# Patient Record
Sex: Female | Born: 1980 | Race: White | Hispanic: No | Marital: Single | Smoking: Never smoker
Health system: Southern US, Community
[De-identification: ages and names within clinical notes are randomized; demographics above are authoritative.]

## PROBLEM LIST (undated history)

## (undated) DIAGNOSIS — D649 Anemia, unspecified: Secondary | ICD-10-CM

## (undated) DIAGNOSIS — E7212 Methylenetetrahydrofolate reductase deficiency: Secondary | ICD-10-CM

## (undated) DIAGNOSIS — D069 Carcinoma in situ of cervix, unspecified: Secondary | ICD-10-CM

## (undated) DIAGNOSIS — F419 Anxiety disorder, unspecified: Secondary | ICD-10-CM

## (undated) DIAGNOSIS — O99280 Endocrine, nutritional and metabolic diseases complicating pregnancy, unspecified trimester: Secondary | ICD-10-CM

## (undated) DIAGNOSIS — F329 Major depressive disorder, single episode, unspecified: Secondary | ICD-10-CM

## (undated) DIAGNOSIS — F32A Depression, unspecified: Secondary | ICD-10-CM

## (undated) HISTORY — PX: DILATION AND CURETTAGE OF UTERUS: SHX78

## (undated) HISTORY — PX: OTHER SURGICAL HISTORY: SHX169

---

## 2012-11-03 ENCOUNTER — Ambulatory Visit: Payer: Self-pay | Admitting: Gynecologic Oncology

## 2012-12-07 ENCOUNTER — Emergency Department (HOSPITAL_COMMUNITY): Payer: 59

## 2012-12-07 ENCOUNTER — Encounter (HOSPITAL_COMMUNITY): Payer: Self-pay | Admitting: *Deleted

## 2012-12-07 ENCOUNTER — Emergency Department (HOSPITAL_COMMUNITY)
Admission: EM | Admit: 2012-12-07 | Discharge: 2012-12-07 | Disposition: A | Discharge status: Home / Self Care | Payer: 59 | Attending: Emergency Medicine | Admitting: Emergency Medicine

## 2012-12-07 DIAGNOSIS — R110 Nausea: Secondary | ICD-10-CM | POA: Insufficient documentation

## 2012-12-07 DIAGNOSIS — R630 Anorexia: Secondary | ICD-10-CM | POA: Insufficient documentation

## 2012-12-07 DIAGNOSIS — Z79899 Other long term (current) drug therapy: Secondary | ICD-10-CM | POA: Insufficient documentation

## 2012-12-07 DIAGNOSIS — Z3202 Encounter for pregnancy test, result negative: Secondary | ICD-10-CM | POA: Insufficient documentation

## 2012-12-07 DIAGNOSIS — Z888 Allergy status to other drugs, medicaments and biological substances status: Secondary | ICD-10-CM | POA: Insufficient documentation

## 2012-12-07 DIAGNOSIS — R1013 Epigastric pain: Secondary | ICD-10-CM

## 2012-12-07 DIAGNOSIS — C539 Malignant neoplasm of cervix uteri, unspecified: Secondary | ICD-10-CM | POA: Insufficient documentation

## 2012-12-07 HISTORY — DX: Endocrine, nutritional and metabolic diseases complicating pregnancy, unspecified trimester: O99.280

## 2012-12-07 HISTORY — DX: Methylenetetrahydrofolate reductase deficiency: E72.12

## 2012-12-07 LAB — CBC WITH DIFFERENTIAL/PLATELET
Basophils Absolute: 0 10*3/uL (ref 0.0–0.1)
Lymphocytes Relative: 32 % (ref 12–46)
Lymphs Abs: 1.9 10*3/uL (ref 0.7–4.0)
Neutrophils Relative %: 54 % (ref 43–77)
Platelets: 270 10*3/uL (ref 150–400)
RBC: 4.1 MIL/uL (ref 3.87–5.11)
WBC: 5.8 10*3/uL (ref 4.0–10.5)

## 2012-12-07 LAB — URINALYSIS, ROUTINE W REFLEX MICROSCOPIC
Bilirubin Urine: NEGATIVE
Glucose, UA: NEGATIVE mg/dL
Specific Gravity, Urine: 1.017 (ref 1.005–1.030)
Urobilinogen, UA: 0.2 mg/dL (ref 0.0–1.0)

## 2012-12-07 LAB — URINE MICROSCOPIC-ADD ON

## 2012-12-07 LAB — POCT PREGNANCY, URINE: Preg Test, Ur: NEGATIVE

## 2012-12-07 LAB — COMPREHENSIVE METABOLIC PANEL
ALT: 16 U/L (ref 0–35)
AST: 18 U/L (ref 0–37)
Alkaline Phosphatase: 49 U/L (ref 39–117)
CO2: 25 mEq/L (ref 19–32)
GFR calc Af Amer: >90 mL/min (ref >90)
GFR calc non Af Amer: >90 mL/min (ref >90)
Glucose, Bld: 92 mg/dL (ref 70–99)
Potassium: 3.5 mEq/L (ref 3.5–5.1)
Sodium: 137 mEq/L (ref 135–145)
Total Protein: 6.9 g/dL (ref 6.0–8.3)

## 2012-12-07 MED ORDER — OMEPRAZOLE 20 MG PO CPDR: 20.0000 mg | DELAYED_RELEASE_CAPSULE | Freq: Every day | ORAL | Status: DC

## 2012-12-07 MED ORDER — GI COCKTAIL ~~LOC~~
30.0000 mL | Freq: Once | ORAL | Status: AC
  Administered 2012-12-07: 30 mL via ORAL
  Filled 2012-12-07: qty 30

## 2012-12-07 MED ORDER — SODIUM CHLORIDE 0.9 % IV SOLN
INTRAVENOUS | Status: DC
  Administered 2012-12-07: 08:00:00 via INTRAVENOUS

## 2012-12-07 MED ORDER — PERCOCET 5-325 MG PO TABS: 1.0000 | ORAL_TABLET | Freq: Four times a day (QID) | ORAL | Status: DC | PRN

## 2012-12-07 MED ORDER — ONDANSETRON HCL 4 MG/2ML IJ SOLN
4.0000 mg | Freq: Once | INTRAMUSCULAR | Status: AC
  Administered 2012-12-07: 4 mg via INTRAVENOUS
  Filled 2012-12-07: qty 2

## 2012-12-07 MED ORDER — ONDANSETRON 4 MG PO TBDP: 4.0000 mg | ORAL_TABLET | Freq: Four times a day (QID) | ORAL | Status: DC | PRN

## 2012-12-07 MED ORDER — MORPHINE SULFATE 4 MG/ML IJ SOLN
4.0000 mg | Freq: Once | INTRAMUSCULAR | Status: AC
Start: 2012-12-07 — End: 2012-12-07
  Administered 2012-12-07: 4 mg via INTRAVENOUS
  Filled 2012-12-07: qty 1

## 2012-12-07 MED ORDER — RANITIDINE HCL 150 MG PO TABS: 150.0000 mg | ORAL_TABLET | Freq: Two times a day (BID) | ORAL | Status: DC

## 2012-12-07 NOTE — ED Provider Notes (Signed)
Medical screening examination/treatment/procedure(s) were conducted as a shared visit with non-physician practitioner(s) and myself.  I personally evaluated the patient during the encounter  RUQ tenderness. No clear evidence of gallstones on bedside ultrasound--will obtain formal study.   Hanley Seamen, MD 12/07/12 [phone]

## 2012-12-07 NOTE — ED Provider Notes (Signed)
History     CSN: [id number]  Arrival date & time 12/07/12  0620   None     Chief Complaint  Patient presents with  . Abdominal Pain    Abd pain that started last night pain is midline with a sharpe sensation and increases with activity    (Consider location/radiation/quality/duration/timing/severity/associated sxs/prior treatment) HPI Kathleen Sanford is a 32 y.o. female with a history of cervical cancer (cold knife cone biopsy on 4/28 by Dr. Nelly Rout in [facility]) presents emergency department complaining of epigastric and right upper quadrant abdominal pain.  Onset of symptoms began last evening around 8 p.m. and are associated with nausea that started this morning.  Pain is described as a sharp stabbing sensation without radiation as well as a burning type sensation in the epigastric area..  Associated symptoms include anorexia and patient has not eaten since onset.  Severity of pain is described 10/10, however patient states that she did not want any narcotic pain medication and she would like to return to work if there's nothing surgical.  She denies fevers, night sweats, chills, change in bowel movements, emesis, history of abdominal surgery.  Past Medical History  Diagnosis Date  . MTHFR deficiency complicating pregnancy     History reviewed. No pertinent past surgical history.  History reviewed. No pertinent family history.  History  Substance Use Topics  . Smoking status: Never Smoker   . Smokeless tobacco: Not on file  . Alcohol Use: Yes     Comment: socially    OB History   Grav Para Term Preterm Abortions TAB SAB Ect Mult Living                  Review of Systems Ten systems reviewed and are negative for acute change, except as noted in the HPI.    Allergies  Norco and Toradol  Home Medications   Current Outpatient Rx  Name  Route  Sig  Dispense  Refill  . Vitamin D, Ergocalciferol, (DRISDOL) 50000 UNITS CAPS   Oral   Take 50,000 Units by mouth every  7 (seven) days. Takes on Friday           BP 103/66  Pulse 81  Temp(Src) 97.9 F (36.6 C) (Oral)  Resp 18  SpO2 100%  Physical Exam  Nursing note and vitals reviewed. Constitutional: Vital signs are normal. She appears well-developed and well-nourished. No distress.  HENT:  Head: Normocephalic and atraumatic.  Mouth/Throat: Uvula is midline, oropharynx is clear and moist and mucous membranes are normal.  Eyes: Conjunctivae and EOM are normal. Pupils are equal, round, and reactive to light.  Neck: Normal range of motion and full passive range of motion without pain. Neck supple. No spinous process tenderness and no muscular tenderness present. No rigidity. No Brudzinski's sign noted.  Cardiovascular: Normal rate and regular rhythm.   Pulmonary/Chest: Effort normal and breath sounds normal. No accessory muscle usage. Not tachypneic. No respiratory distress.  Abdominal: Soft. Normal appearance. She exhibits no distension, no ascites, no pulsatile midline mass and no mass. There is tenderness. There is no CVA tenderness. No hernia.    Lymphadenopathy:    She has no cervical adenopathy.  Neurological: She is alert.  Skin: Skin is warm and dry. No rash noted. She is not diaphoretic.  Psychiatric: She has a normal mood and affect. Her speech is normal and behavior is normal.    ED Course  Procedures (including critical care time)  Labs Reviewed  CBC WITH DIFFERENTIAL -  Abnormal; Notable for the following:    Hemoglobin 11.7 (*)    HCT 34.4 (*)    All other components within normal limits  URINALYSIS, ROUTINE W REFLEX MICROSCOPIC - Abnormal; Notable for the following:    Hgb urine dipstick LARGE (*)    All other components within normal limits  COMPREHENSIVE METABOLIC PANEL  LIPASE, BLOOD  URINE MICROSCOPIC-ADD ON  POCT PREGNANCY, URINE   US Abdomen Complete  12/07/2012   *RADIOLOGY REPORT*  Clinical Data:  Right upper quadrant/epigastric abdominal pain.  COMPLETE ABDOMINAL  ULTRASOUND  Comparison:  None  Findings:  Gallbladder: A small amount of debris or sludge is noted within the gallbladder lumen.  No discrete gallstones are demonstrated.  There is no gallbladder wall thickening, pericholecystic fluid or sonographic Murphy's sign.  Common bile duct:   Normal in caliber without filling defects.  Liver:  Echogenicity is within normal limits.  No focal hepatic abnormalities are identified.  IVC:  Visualized portions appear unremarkable.  Pancreas:  Visualized portions appear unremarkable.Portions of the pancreatic tail are obscured by bowel gas.  Spleen:  Visualized portions appear unremarkable.  Right Kidney:   The renal cortical thickness and echogenicity are preserved.  There is no hydronephrosis or focal abnormality. Renal length is 11.0 cm.  Left Kidney:   The renal cortical thickness and echogenicity are preserved.  There is no hydronephrosis or focal abnormality. Renal length is 11.9 cm.  Abdominal aorta:  Visualized portions appear unremarkable.  IMPRESSION:  1.  No acute abdominal findings identified. 2.  Trace debris or sludge within the gallbladder lumen.  No evidence of cholelithiasis or cholecystitis. 3.  Suboptimal visualization of the pancreatic tail.   Original Report Authenticated By: Carey Bullocks, M.D.     No diagnosis found.    MDM  Epigastric abdominal pain (biliary colic vs GERD, vs PUD)  Patient is nontoxic, nonseptic appearing, in no apparent distress.  Patient's pain and other symptoms adequately managed in emergency department.  Fluid bolus given.  Labs, imaging and vitals reviewed.   On repeat exam patient does not have a surgical abdomin and there are nor peritoneal signs.  Patient started on PPI and advised to take H2 blocker for the next 3 days as well. Discussed avoiding NSAIDs use until abdominal symptoms resolve as there may be concern for Ulcer. Discharged home with symptomatic treatment and given strict instructions for follow-up with  their primary care physician.  I have also discussed reasons to return immediately to the ER including movement of abd pain to the lower abdomen, fevers, melena, or anginal type Chest Pain.  Patient expresses understanding and agrees with plan.           Jaci Carrel, [place] 12/07/12 [phone]

## 2012-12-07 NOTE — ED Provider Notes (Signed)
Medical screening examination/treatment/procedure(s) were performed by non-physician practitioner and as supervising physician I was immediately available for consultation/collaboration.   Hanley Seamen, MD 12/07/12 [phone]

## 2012-12-07 NOTE — ED Notes (Signed)
4540  Pt arrives to the ED with midline abdominal pain that increases with activity.  Pt states the pain is stabbing and she is no also c/o nausea that started this morning.  Pain is 10/10 upon arrival to the ER.

## 2012-12-28 ENCOUNTER — Encounter (HOSPITAL_COMMUNITY): Payer: Self-pay | Admitting: Emergency Medicine

## 2012-12-28 ENCOUNTER — Emergency Department (HOSPITAL_COMMUNITY)
Admission: EM | Admit: 2012-12-28 | Discharge: 2012-12-28 | Disposition: A | Discharge status: Home / Self Care | Payer: 59 | Attending: Emergency Medicine | Admitting: Emergency Medicine

## 2012-12-28 DIAGNOSIS — Z8742 Personal history of other diseases of the female genital tract: Secondary | ICD-10-CM | POA: Insufficient documentation

## 2012-12-28 DIAGNOSIS — R51 Headache: Secondary | ICD-10-CM | POA: Insufficient documentation

## 2012-12-28 NOTE — ED Notes (Signed)
Pt reports head pain onset last night, about 30 mins ago pt began to experience left side facial numbness. Pt reports had a small amount of numbness to left hand but has since resolved.

## 2012-12-28 NOTE — ED Notes (Signed)
Pt alert and mentating appropriately upon d/c. Pt given d/c teaching and follow up care instructions. Pt verbalizes understanding of d/c teaching and has no further questions upon d/c. NAD noted upon d/c. Pt ambulatory leaving ER.

## 2012-12-28 NOTE — ED Provider Notes (Signed)
History    CSN: [id number] Arrival date & time 12/28/12  1021  First MD Initiated Contact with Patient 12/28/12 1051     Chief Complaint  Patient presents with  . Numbness    left side of face   (Consider location/radiation/quality/duration/timing/severity/associated sxs/prior Treatment) HPI Comments: Patient is a 32 year old woman who felt a pain in the left side of her scalp near the vertex yesterday evening. She took Advil and got some relief. This morning it hurt really bad, and she also had a numb feeling in the left side of her face and her cheek and jaw area. There has been no fever. She's had no recent infection. There's been no injury. She has had abnormal cervical cytology and had a cone biopsy on May 28, and has had intermittent bleeding from that since then.  Patient is a 32 y.o. female presenting with neurologic complaint. The history is provided by the patient. No language interpreter was used.  Neurologic Problem This is a new problem. The current episode started 12 to 24 hours ago. The problem has not changed since onset.Pertinent negatives include no chest pain, no abdominal pain, no headaches and no shortness of breath. Nothing aggravates the symptoms. Relieved by: She took Advil with some relief.   Past Medical History  Diagnosis Date  . MTHFR deficiency complicating pregnancy    Past Surgical History  Procedure Laterality Date  . Cesarean section     No family history on file. History  Substance Use Topics  . Smoking status: Never Smoker   . Smokeless tobacco: Not on file  . Alcohol Use: Yes     Comment: socially   OB History   Grav Para Term Preterm Abortions TAB SAB Ect Mult Living                 Review of Systems  Constitutional: Negative for fever and chills.  HENT: Negative.   Eyes: Negative.   Respiratory: Negative for shortness of breath.   Cardiovascular: Negative for chest pain.  Gastrointestinal: Negative for abdominal pain.   Genitourinary: Negative.   Musculoskeletal: Negative.   Skin:       No rash noted.  Neurological: Negative for headaches.  Psychiatric/Behavioral: Negative.     Allergies  Norco and Toradol  Home Medications   Current Outpatient Rx  Name  Route  Sig  Dispense  Refill  . omeprazole (PRILOSEC) 20 MG capsule   Oral   Take 20 mg by mouth daily.         . Vitamin D, Ergocalciferol, (DRISDOL) 50000 UNITS CAPS   Oral   Take 50,000 Units by mouth every 7 (seven) days. Takes on Friday          BP 136/77  Pulse 112  Temp(Src) 98.6 F (37 C) (Oral)  Ht 5\' 5"  (1.651 m)  Wt 174 lb (78.926 kg)  BMI 28.96 kg/m2  SpO2 100% Physical Exam  Nursing note and vitals reviewed. Constitutional: She is oriented to person, place, and time. She appears well-developed and well-nourished.  HENT:  Head: Normocephalic.  Right Ear: External ear normal.  Left Ear: External ear normal.  Mouth/Throat: Oropharynx is clear and moist.  No rash seen in the left temporal pole and parietal scalp.  Eyes: Conjunctivae and EOM are normal. Pupils are equal, round, and reactive to light.  Neck: Normal range of motion. Neck supple.  Cardiovascular: Normal rate, regular rhythm and normal heart sounds.   Pulmonary/Chest: Effort normal and breath sounds normal.  Abdominal: Soft. Bowel sounds are normal.  Musculoskeletal: Normal range of motion. She exhibits no edema and no tenderness.  Neurological: She is alert and oriented to person, place, and time.  No facial asymmetry. No sensory or motor deficit. Deep tendon reflexes normal.  Skin: Skin is warm and dry.  No rash in the distribution of the second or third divisions of the trigeminal nerve.  Psychiatric: She has a normal mood and affect. Her behavior is normal.    ED Course  Procedures (including critical care time) Labs Reviewed - No data to display  11:29 AM  Date: 12/28/2012  Rate: 111  Rhythm: sinus tachycardia  QRS Axis: normal   Intervals: normal  ST/T Wave abnormalities: normal  Conduction Disutrbances:none  Narrative Interpretation: Sinus tachycardia  Old EKG Reviewed: none available  Course in ED: The patient was seen and had physical examination. Differential diagnosis for her situation include early herpes zoster, early Bell's palsy, or atypical migraine. At present she is asymptomatic, and so no treatment is offered at present. She was advised of this differential diagnosis and can observe this at home.  She has no primary care physician, so was referred to Herb Grays, M.D., with whom she can establish primary medical care.      1. Headache         Carleene Cooper III, MD 12/29/12 1726

## 2013-01-26 ENCOUNTER — Encounter: Payer: Self-pay | Admitting: Gynecologic Oncology

## 2013-01-26 ENCOUNTER — Ambulatory Visit: Payer: PRIVATE HEALTH INSURANCE | Attending: Gynecologic Oncology | Admitting: Gynecologic Oncology

## 2013-01-26 ENCOUNTER — Other Ambulatory Visit (HOSPITAL_COMMUNITY)
Admission: RE | Admit: 2013-01-26 | Discharge: 2013-01-26 | Disposition: A | Discharge status: Home / Self Care | Payer: PRIVATE HEALTH INSURANCE | Source: Ambulatory Visit | Attending: Gynecologic Oncology | Admitting: Gynecologic Oncology

## 2013-01-26 VITALS — BP 102/70 | HR 80 | Temp 98.6°F | Resp 16 | Ht 65.0 in | Wt 180.0 lb

## 2013-01-26 DIAGNOSIS — Z975 Presence of (intrauterine) contraceptive device: Secondary | ICD-10-CM | POA: Insufficient documentation

## 2013-01-26 DIAGNOSIS — Z01419 Encounter for gynecological examination (general) (routine) without abnormal findings: Secondary | ICD-10-CM | POA: Insufficient documentation

## 2013-01-26 DIAGNOSIS — D069 Carcinoma in situ of cervix, unspecified: Secondary | ICD-10-CM | POA: Insufficient documentation

## 2013-01-26 NOTE — Patient Instructions (Signed)
F/U with Dr. Seymour Bars in 6 months Will call with Pap and ultrasound results

## 2013-01-26 NOTE — Progress Notes (Signed)
GYNECOLOGIC ONCOLOGY RETURN VISIT   REASON FOR VISIT:CIN III  ASSESSMENT AND PLAN:  Kathleen Sanford is a 32 y.o. woman with CIN3 s/p CKC complicated by post op bleeding. Cervix healed at this visit. Pap collected. Unable to visualize the IUD. UTZ ordered to assess position.  If pap negative f/u with Dr. Seymour Bars in 6 months    HPI 32 year old with a history of CIN-3 status post cold knife cone on Nov 16, 2012 presenting with persistent vaginal spotting, last seen on 12/16/12. Of note she has a Mirena IUD which was place in April of this year.  She was last seen on 12/16/12 at which time cautery was used to obtain hemostasis at her CKC site. She reports improved bleeding for a few days until 12/23/12 when she was on vacation. On 7/18 cautery of the cervix was performed to control cervical bleeding.  F/U 01/13/2013 spotting was managed with silver nitrate.   PAST MEDICAL/SURGICAL HX:  Past Medical History   Diagnosis  Date   .  CIN III (cervical intraepithelial neoplasia grade III) with severe dysplasia    .  Anemia    .  CIN III (cervical intraepithelial neoplasia grade III) with severe dysplasia     Past Surgical History   Procedure  Laterality  Date   .  Dilation and curettage of uterus     .  Cesarean section       x 3   .  Pr conization cervix,knife/laser  N/A  11/16/2012     Procedure: CONIZATION OF CERVIX W/WO FULGURATION, W/WO DILATION & CURETTAGE, W/WO REPAIR; COLD KNIFE OR LASER; Surgeon: Doreatha Lew, MD; Location: MAIN OR [facility]; Service: Gynecology Oncology    Family History   Problem  Relation  Age of Onset   .  Ovarian cancer  Maternal Grandmother    .  Cancer  Paternal Grandfather    .  Stroke  Paternal Grandfather    .  Mental illness  Maternal Aunt    .  Cancer  Paternal Aunt     History    Social History Narrative   .  None     REVIEW OF SYSTEMS:  Complete 10-system review is negative except as above in Interval History.   PHYSICAL EXAM:  BP 102/70  Pulse  80  Temp(Src) 98.6 F (37 C) (Oral)  Resp 16  Ht 5\' 5"  (1.651 m)  Wt 180 lb (81.647 kg)  BMI 29.95 kg/m2  General: Alert, oriented, no acute distress.  Abdomen: Soft, nontender.  Extremities: Grossly normal range of motion. Warm, well perfused. No edema bilaterally.  Skin: No rashes or lesions noted.  GU: Normal appearing external genitalia without erythema, excoriation, or lesions. Speculum exam reveals healed cervix.   CKC bed with a pinpoint area of active bleeding. IUD not visualized or palpated with the qtip. Pap collected.

## 2013-02-01 ENCOUNTER — Ambulatory Visit (HOSPITAL_COMMUNITY)
Admission: RE | Admit: 2013-02-01 | Discharge: 2013-02-01 | Disposition: A | Discharge status: Home / Self Care | Payer: PRIVATE HEALTH INSURANCE | Source: Ambulatory Visit | Attending: Gynecologic Oncology | Admitting: Gynecologic Oncology

## 2013-02-01 ENCOUNTER — Telehealth: Payer: Self-pay | Admitting: Gynecologic Oncology

## 2013-02-01 DIAGNOSIS — Z30431 Encounter for routine checking of intrauterine contraceptive device: Secondary | ICD-10-CM | POA: Insufficient documentation

## 2013-02-01 NOTE — Telephone Encounter (Signed)
Patient notified of [place] results and pap smear results.  Asking when she needs to follow up.  Informed that Dr. Nelly Rout would be notified for recommendations for follow up and that she would be contacted by the office.  Instructed to call for any needs or concerns before that time.

## 2013-02-03 ENCOUNTER — Telehealth: Payer: Self-pay | Admitting: Gynecologic Oncology

## 2013-02-03 NOTE — Telephone Encounter (Signed)
Patient informed of Dr. Forrestine Him recommendations for follow up with Dr. Seymour Bars and GYN Onc PRN.  Verbalizing understanding.  Instructed to call for any needs.

## 2013-09-21 ENCOUNTER — Encounter (HOSPITAL_COMMUNITY): Payer: Self-pay | Admitting: Emergency Medicine

## 2013-09-21 ENCOUNTER — Emergency Department (HOSPITAL_COMMUNITY)
Admission: EM | Admit: 2013-09-21 | Discharge: 2013-09-21 | Disposition: A | Discharge status: Home / Self Care | Payer: PRIVATE HEALTH INSURANCE | Attending: Emergency Medicine | Admitting: Emergency Medicine

## 2013-09-21 DIAGNOSIS — J209 Acute bronchitis, unspecified: Secondary | ICD-10-CM | POA: Insufficient documentation

## 2013-09-21 DIAGNOSIS — R630 Anorexia: Secondary | ICD-10-CM | POA: Insufficient documentation

## 2013-09-21 DIAGNOSIS — J029 Acute pharyngitis, unspecified: Secondary | ICD-10-CM

## 2013-09-21 DIAGNOSIS — Z8541 Personal history of malignant neoplasm of cervix uteri: Secondary | ICD-10-CM | POA: Insufficient documentation

## 2013-09-21 DIAGNOSIS — J40 Bronchitis, not specified as acute or chronic: Secondary | ICD-10-CM

## 2013-09-21 DIAGNOSIS — R110 Nausea: Secondary | ICD-10-CM | POA: Insufficient documentation

## 2013-09-21 DIAGNOSIS — IMO0001 Reserved for inherently not codable concepts without codable children: Secondary | ICD-10-CM | POA: Insufficient documentation

## 2013-09-21 DIAGNOSIS — R42 Dizziness and giddiness: Secondary | ICD-10-CM | POA: Insufficient documentation

## 2013-09-21 DIAGNOSIS — R51 Headache: Secondary | ICD-10-CM | POA: Insufficient documentation

## 2013-09-21 DIAGNOSIS — H9209 Otalgia, unspecified ear: Secondary | ICD-10-CM | POA: Insufficient documentation

## 2013-09-21 DIAGNOSIS — Z79899 Other long term (current) drug therapy: Secondary | ICD-10-CM | POA: Insufficient documentation

## 2013-09-21 HISTORY — DX: Carcinoma in situ of cervix, unspecified: D06.9

## 2013-09-21 LAB — RAPID STREP SCREEN (MED CTR MEBANE ONLY): Streptococcus, Group A Screen (Direct): NEGATIVE

## 2013-09-21 MED ORDER — BENZONATATE 100 MG PO CAPS: 100.0000 mg | ORAL_CAPSULE | Freq: Three times a day (TID) | ORAL | Status: DC

## 2013-09-21 MED ORDER — AZITHROMYCIN 250 MG PO TABS: 250.0000 mg | ORAL_TABLET | Freq: Every day | ORAL | Status: DC

## 2013-09-21 NOTE — ED Provider Notes (Signed)
CSN: [id number]     Arrival date & time 09/21/13  2050 History   First MD Initiated Contact with Patient 09/21/13 2132     Chief Complaint  Patient presents with  . Sore Throat     (Consider location/radiation/quality/duration/timing/severity/associated sxs/prior Treatment) Patient is a 33 y.o. female presenting with pharyngitis. The history is provided by the patient.  Sore Throat This is a new problem. The current episode started in the past 7 days. The problem occurs constantly. The problem has been gradually worsening. Associated symptoms include anorexia, chills, congestion, coughing, fatigue, headaches, myalgias, nausea and swollen glands. Pertinent negatives include no fever, rash, urinary symptoms or vomiting. The symptoms are aggravated by eating and swallowing. She has tried acetaminophen and NSAIDs for the symptoms. The treatment provided mild relief.   LILLYONA POLASEK is a 33 y.o. female who presents to the ED with flu like symptoms that started about a week ago. She thought was just allergies but has continue to get worse. Cough is dry, nonproductive.   Past Medical History  Diagnosis Date  . MTHFR deficiency complicating pregnancy   . Carcinoma in situ of cervix    Past Surgical History  Procedure Laterality Date  . Cesarean section     History reviewed. No pertinent family history. History  Substance Use Topics  . Smoking status: Never Smoker   . Smokeless tobacco: Not on file  . Alcohol Use: Yes     Comment: socially   OB History   Grav Para Term Preterm Abortions TAB SAB Ect Mult Living                 Review of Systems  Constitutional: Positive for chills and fatigue. Negative for fever.  HENT: Positive for congestion, ear pain and sinus pressure. Negative for facial swelling and trouble swallowing.   Eyes: Negative for redness and visual disturbance.  Respiratory: Positive for cough.   Gastrointestinal: Positive for nausea and anorexia. Negative for  vomiting, diarrhea and constipation.  Genitourinary: Negative for dysuria, urgency and frequency.  Musculoskeletal: Positive for myalgias.  Skin: Negative for rash.  Neurological: Positive for light-headedness and headaches.  Psychiatric/Behavioral: Negative for confusion. The patient is not nervous/anxious.       Allergies  Norco and Toradol  Home Medications   Current Outpatient Rx  Name  Route  Sig  Dispense  Refill  . omeprazole (PRILOSEC) 20 MG capsule   Oral   Take 20 mg by mouth daily.         . Vitamin D, Ergocalciferol, (DRISDOL) 50000 UNITS CAPS   Oral   Take 50,000 Units by mouth every 7 (seven) days. Takes on Friday          BP 129/60  Pulse 68  Temp(Src) 97.9 F (36.6 C) (Oral)  Resp 20  SpO2 100%  LMP 09/15/2013 Physical Exam  Nursing note and vitals reviewed. Constitutional: She is oriented to person, place, and time. She appears well-developed and well-nourished. No distress.  HENT:  Head: Normocephalic.  Right Ear: Tympanic membrane normal.  Left Ear: Tympanic membrane normal.  Nose: Mucosal edema and rhinorrhea present.  Mouth/Throat: Uvula is midline and mucous membranes are normal. Posterior oropharyngeal erythema present.  Eyes: Conjunctivae and EOM are normal.  Neck: Neck supple.  Cardiovascular: Normal rate, regular rhythm and normal heart sounds.   Pulmonary/Chest: Effort normal. She has no wheezes. She has no rales.  Abdominal: Soft. There is no tenderness.  Musculoskeletal: Normal range of motion.  Lymphadenopathy:  She has cervical adenopathy.  Neurological: She is alert and oriented to person, place, and time. No cranial nerve deficit.  Skin: Skin is warm and dry.  Psychiatric: She has a normal mood and affect. Her behavior is normal.    ED Course  Procedures Discussed CXR and patient states she does not want one at this time if not absolutely necessary.   MDM  33 y.o. female with cough, congestion and sore throat x one  week. Will treat for bronchitis and she will follow up  With her PCP or return here for worsening symptoms. Stable for discharge without fever and O2 SAT 100% on R/A. She does not appear septic. Discussed with the patient clinical findings and plan of care and all questioned fully answered. She will return if any problems arise.   Medication List         azithromycin 250 MG tablet  Commonly known as:  ZITHROMAX  Take 1 tablet (250 mg total) by mouth daily. Take first 2 tablets together, then 1 every day until finished.     benzonatate 100 MG capsule  Commonly known as:  TESSALON  Take 1 capsule (100 mg total) by mouth every 8 (eight) hours.           [place], [place] 09/22/13 [phone]

## 2013-09-21 NOTE — ED Notes (Signed)
Pt seen by [facility] and assessment and evaluation performed.

## 2013-09-21 NOTE — Discharge Instructions (Signed)
Your strep screen was negative. Take the medication as directed. Continue tylenol and ibuprofen as needed for for pain or fever. Return for worsening symptoms.

## 2013-09-21 NOTE — ED Notes (Signed)
PT C/O SORE THROAT X 1 WEEK.  PT STATES WHEN SHE COUGHS OR SNEEZES SHE FEELS LIKE SOMEONE IS STABBING HER WITH A TOOTHPICK. PT ALSO FEELS TIRED AND HAS HAD 2 COLD SORES THIS PAST WEEK.

## 2013-09-23 LAB — CULTURE, GROUP A STREP

## 2013-09-25 NOTE — ED Provider Notes (Signed)
Medical screening examination/treatment/procedure(s) were performed by non-physician practitioner and as supervising physician I was immediately available for consultation/collaboration.   EKG Interpretation None       Nat Christen, MD 09/25/13 [phone]

## 2014-02-18 ENCOUNTER — Encounter (HOSPITAL_COMMUNITY): Payer: Self-pay | Admitting: Emergency Medicine

## 2014-02-18 ENCOUNTER — Emergency Department (HOSPITAL_COMMUNITY)
Admission: EM | Admit: 2014-02-18 | Discharge: 2014-02-18 | Disposition: A | Discharge status: Home / Self Care | Payer: 59 | Attending: Emergency Medicine | Admitting: Emergency Medicine

## 2014-02-18 DIAGNOSIS — H6501 Acute serous otitis media, right ear: Secondary | ICD-10-CM

## 2014-02-18 DIAGNOSIS — J029 Acute pharyngitis, unspecified: Secondary | ICD-10-CM

## 2014-02-18 DIAGNOSIS — H6500 Acute serous otitis media, unspecified ear: Secondary | ICD-10-CM | POA: Diagnosis not present

## 2014-02-18 DIAGNOSIS — Z8541 Personal history of malignant neoplasm of cervix uteri: Secondary | ICD-10-CM | POA: Diagnosis not present

## 2014-02-18 MED ORDER — AMOXICILLIN 250 MG PO CAPS
500.0000 mg | ORAL_CAPSULE | Freq: Once | ORAL | Status: AC
  Administered 2014-02-18: 500 mg via ORAL
  Filled 2014-02-18: qty 2

## 2014-02-18 MED ORDER — AMOXICILLIN 500 MG PO CAPS: 500.0000 mg | ORAL_CAPSULE | Freq: Three times a day (TID) | ORAL | Status: AC

## 2014-02-18 NOTE — ED Provider Notes (Signed)
CSN: [id number]     Arrival date & time 02/18/14  2005 History   First MD Initiated Contact with Patient 02/18/14 2124     Chief Complaint  Patient presents with  . Sore Throat     (Consider location/radiation/quality/duration/timing/severity/associated sxs/prior Treatment) Patient is a 33 y.o. female presenting with pharyngitis. The history is provided by the patient.  Sore Throat This is a new problem. The current episode started yesterday. The problem occurs constantly. The problem has been gradually worsening.   Kathleen Sanford is a 33 y.o. female who presents to the ED with sore throat and right ear pain that started 2 days ago. He son is also sick with tonsillitis. She has had a dry cough that is causing more irritation  To her throat.  Past Medical History  Diagnosis Date  . MTHFR deficiency complicating pregnancy   . Carcinoma in situ of cervix    Past Surgical History  Procedure Laterality Date  . Cesarean section     No family history on file. History  Substance Use Topics  . Smoking status: Never Smoker   . Smokeless tobacco: Not on file  . Alcohol Use: Yes     Comment: socially   OB History   Grav Para Term Preterm Abortions TAB SAB Ect Mult Living                 Review of Systems Negative except as stated in HPI   Allergies  Norco and Toradol  Home Medications   Prior to Admission medications   Medication Sig Start Date End Date Taking? Authorizing Provider  azithromycin (ZITHROMAX) 250 MG tablet Take 1 tablet (250 mg total) by mouth daily. Take first 2 tablets together, then 1 every day until finished. 09/21/13   Hope Bunnie Pion, NP  benzonatate (TESSALON) 100 MG capsule Take 1 capsule (100 mg total) by mouth every 8 (eight) hours. 09/21/13   Hope Bunnie Pion, NP   BP 119/77  Pulse 74  Temp(Src) 99 F (37.2 C) (Oral)  Resp 20  Ht 5\' 5"  (1.651 m)  Wt 178 lb (80.74 kg)  BMI 29.62 kg/m2  SpO2 99%  LMP 01/26/2014 Physical Exam  Nursing note and vitals  reviewed. Constitutional: She is oriented to person, place, and time. She appears well-developed and well-nourished. No distress.  HENT:  Head: Normocephalic.  Right Ear: Tympanic membrane is erythematous.  Left Ear: Tympanic membrane normal.  Nose: Nose normal.  Mouth/Throat: Uvula is midline and mucous membranes are normal. Posterior oropharyngeal erythema present.  Eyes: EOM are normal.  Neck: Neck supple.  Cardiovascular: Normal rate.   Pulmonary/Chest: Effort normal.  Musculoskeletal: Normal range of motion.  Lymphadenopathy:    She has no cervical adenopathy.  Neurological: She is alert and oriented to person, place, and time. No cranial nerve deficit.  Skin: Skin is warm and dry.  Psychiatric: She has a normal mood and affect. Her behavior is normal.    ED Course  Procedures (including critical care time) Labs Review  MDM  33 y.o. female with sore throat and ear pain x 2 days. Will treat for otitis media and she will follow up with her PCP in one week to be sure the infection is improving. She will return here as needed for worsening symptoms.       [facility] Bunnie Pion, [place] 02/19/14 [phone]

## 2014-02-18 NOTE — ED Notes (Signed)
Pt c/o sore throat and pain when swallowing x 2 days.

## 2014-02-18 NOTE — Discharge Instructions (Signed)
Follow up with your doctor in one week to be sure the ear infection is improving. Return here as needed.

## 2014-02-20 NOTE — ED Provider Notes (Signed)
Medical screening examination/treatment/procedure(s) were performed by non-physician practitioner and as supervising physician I was immediately available for consultation/collaboration.   EKG Interpretation None        Sharyon Cable, MD 02/20/14 1204

## 2014-07-06 ENCOUNTER — Encounter (HOSPITAL_COMMUNITY): Payer: Self-pay | Admitting: *Deleted

## 2014-07-12 ENCOUNTER — Other Ambulatory Visit: Payer: Self-pay | Admitting: Obstetrics & Gynecology

## 2014-07-24 ENCOUNTER — Ambulatory Visit (HOSPITAL_COMMUNITY): Payer: Managed Care, Other (non HMO) | Admitting: Anesthesiology

## 2014-07-24 ENCOUNTER — Encounter (HOSPITAL_COMMUNITY)
Admission: RE | Disposition: A | Discharge status: Home / Self Care | Payer: Self-pay | Source: Ambulatory Visit | Attending: Obstetrics & Gynecology

## 2014-07-24 ENCOUNTER — Encounter (HOSPITAL_COMMUNITY): Payer: Self-pay | Admitting: *Deleted

## 2014-07-24 ENCOUNTER — Ambulatory Visit (HOSPITAL_COMMUNITY)
Admission: RE | Admit: 2014-07-24 | Discharge: 2014-07-24 | Disposition: A | Discharge status: Home / Self Care | Payer: Managed Care, Other (non HMO) | Source: Ambulatory Visit | Attending: Obstetrics & Gynecology | Admitting: Obstetrics & Gynecology

## 2014-07-24 DIAGNOSIS — Z886 Allergy status to analgesic agent status: Secondary | ICD-10-CM | POA: Diagnosis not present

## 2014-07-24 DIAGNOSIS — N840 Polyp of corpus uteri: Secondary | ICD-10-CM | POA: Diagnosis not present

## 2014-07-24 DIAGNOSIS — F419 Anxiety disorder, unspecified: Secondary | ICD-10-CM | POA: Diagnosis not present

## 2014-07-24 DIAGNOSIS — Z888 Allergy status to other drugs, medicaments and biological substances status: Secondary | ICD-10-CM | POA: Diagnosis not present

## 2014-07-24 DIAGNOSIS — F329 Major depressive disorder, single episode, unspecified: Secondary | ICD-10-CM | POA: Insufficient documentation

## 2014-07-24 DIAGNOSIS — Z8541 Personal history of malignant neoplasm of cervix uteri: Secondary | ICD-10-CM | POA: Insufficient documentation

## 2014-07-24 HISTORY — DX: Major depressive disorder, single episode, unspecified: F32.9

## 2014-07-24 HISTORY — DX: Anemia, unspecified: D64.9

## 2014-07-24 HISTORY — DX: Anxiety disorder, unspecified: F41.9

## 2014-07-24 HISTORY — PX: DILITATION & CURRETTAGE/HYSTROSCOPY WITH VERSAPOINT RESECTION: SHX5571

## 2014-07-24 HISTORY — DX: Depression, unspecified: F32.A

## 2014-07-24 LAB — CBC
HEMATOCRIT: 37.6 % (ref 36.0–46.0)
Hemoglobin: 12.9 g/dL (ref 12.0–15.0)
MCH: 30.4 pg (ref 26.0–34.0)
MCHC: 34.3 g/dL (ref 30.0–36.0)
MCV: 88.7 fL (ref 78.0–100.0)
Platelets: 262 10*3/uL (ref 150–400)
RBC: 4.24 MIL/uL (ref 3.87–5.11)
RDW: 12.6 % (ref 11.5–15.5)
WBC: 6.9 10*3/uL (ref 4.0–10.5)

## 2014-07-24 LAB — PREGNANCY, URINE: Preg Test, Ur: NEGATIVE

## 2014-07-24 SURGERY — DILATATION & CURETTAGE/HYSTEROSCOPY WITH VERSAPOINT RESECTION
Anesthesia: General | Site: Uterus

## 2014-07-24 MED ORDER — PROPOFOL 10 MG/ML IV BOLUS
INTRAVENOUS | Status: DC | PRN
  Administered 2014-07-24: 150 mg via INTRAVENOUS

## 2014-07-24 MED ORDER — CHLOROPROCAINE HCL 1 % IJ SOLN
INTRAMUSCULAR | Status: DC | PRN
  Administered 2014-07-24: 20 mL

## 2014-07-24 MED ORDER — FENTANYL CITRATE 0.05 MG/ML IJ SOLN
INTRAMUSCULAR | Status: AC
  Filled 2014-07-24: qty 2

## 2014-07-24 MED ORDER — KETOROLAC TROMETHAMINE 30 MG/ML IJ SOLN
INTRAMUSCULAR | Status: AC
  Filled 2014-07-24: qty 1

## 2014-07-24 MED ORDER — MIDAZOLAM HCL 2 MG/2ML IJ SOLN
INTRAMUSCULAR | Status: AC
  Filled 2014-07-24: qty 2

## 2014-07-24 MED ORDER — LIDOCAINE HCL (CARDIAC) 20 MG/ML IV SOLN
INTRAVENOUS | Status: AC
  Filled 2014-07-24: qty 5

## 2014-07-24 MED ORDER — CHLOROPROCAINE HCL 1 % IJ SOLN
INTRAMUSCULAR | Status: AC
  Filled 2014-07-24: qty 30

## 2014-07-24 MED ORDER — LACTATED RINGERS IV SOLN
INTRAVENOUS | Status: DC
  Administered 2014-07-24 (×2): via INTRAVENOUS

## 2014-07-24 MED ORDER — DEXAMETHASONE SODIUM PHOSPHATE 10 MG/ML IJ SOLN
INTRAMUSCULAR | Status: AC
  Filled 2014-07-24: qty 1

## 2014-07-24 MED ORDER — CEFAZOLIN SODIUM-DEXTROSE 2-3 GM-% IV SOLR
INTRAVENOUS | Status: AC
  Filled 2014-07-24: qty 50

## 2014-07-24 MED ORDER — DEXAMETHASONE SODIUM PHOSPHATE 10 MG/ML IJ SOLN
INTRAMUSCULAR | Status: DC | PRN
  Administered 2014-07-24: 10 mg via INTRAVENOUS

## 2014-07-24 MED ORDER — PROPOFOL 10 MG/ML IV BOLUS
INTRAVENOUS | Status: AC
Start: 2014-07-24 — End: 2014-07-24
  Filled 2014-07-24: qty 20

## 2014-07-24 MED ORDER — FENTANYL CITRATE 0.05 MG/ML IJ SOLN
INTRAMUSCULAR | Status: DC | PRN
  Administered 2014-07-24 (×2): 50 ug via INTRAVENOUS

## 2014-07-24 MED ORDER — SODIUM CHLORIDE 0.9 % IR SOLN
Status: DC | PRN
  Administered 2014-07-24: 3000 mL

## 2014-07-24 MED ORDER — ONDANSETRON HCL 4 MG/2ML IJ SOLN
INTRAMUSCULAR | Status: DC | PRN
  Administered 2014-07-24: 4 mg via INTRAVENOUS

## 2014-07-24 MED ORDER — SCOPOLAMINE 1 MG/3DAYS TD PT72
1.0000 | MEDICATED_PATCH | TRANSDERMAL | Status: DC
  Administered 2014-07-24: 1.5 mg via TRANSDERMAL

## 2014-07-24 MED ORDER — ACETAMINOPHEN 160 MG/5ML PO SOLN
960.0000 mg | Freq: Four times a day (QID) | ORAL | Status: DC | PRN
  Administered 2014-07-24: 960 mg via ORAL

## 2014-07-24 MED ORDER — SCOPOLAMINE 1 MG/3DAYS TD PT72
MEDICATED_PATCH | TRANSDERMAL | Status: AC
  Administered 2014-07-24: 1.5 mg via TRANSDERMAL
  Filled 2014-07-24: qty 1

## 2014-07-24 MED ORDER — ONDANSETRON HCL 4 MG/2ML IJ SOLN
INTRAMUSCULAR | Status: AC
  Filled 2014-07-24: qty 2

## 2014-07-24 MED ORDER — LIDOCAINE HCL (CARDIAC) 20 MG/ML IV SOLN
INTRAVENOUS | Status: DC | PRN
  Administered 2014-07-24: 60 mg via INTRAVENOUS

## 2014-07-24 MED ORDER — MIDAZOLAM HCL 2 MG/2ML IJ SOLN
INTRAMUSCULAR | Status: DC | PRN
  Administered 2014-07-24: 2 mg via INTRAVENOUS

## 2014-07-24 MED ORDER — ACETAMINOPHEN 160 MG/5ML PO SOLN
ORAL | Status: AC
  Administered 2014-07-24: 960 mg via ORAL
  Filled 2014-07-24: qty 40.6

## 2014-07-24 MED ORDER — FENTANYL CITRATE 0.05 MG/ML IJ SOLN
25.0000 ug | INTRAMUSCULAR | Status: DC | PRN
  Administered 2014-07-24: 50 ug via INTRAVENOUS

## 2014-07-24 MED ORDER — CEFAZOLIN SODIUM-DEXTROSE 2-3 GM-% IV SOLR
2.0000 g | INTRAVENOUS | Status: AC
  Administered 2014-07-24: 2 g via INTRAVENOUS

## 2014-07-24 SURGICAL SUPPLY — 18 items
CANISTER SUCT 3000ML (MISCELLANEOUS) ×3 IMPLANT
CATH ROBINSON RED A/P 16FR (CATHETERS) ×3 IMPLANT
CLOTH BEACON ORANGE TIMEOUT ST (SAFETY) ×3 IMPLANT
CONTAINER PREFILL 10% NBF 60ML (FORM) ×3 IMPLANT
ELECTRODE ROLLER VERSAPOINT (ELECTRODE) ×3 IMPLANT
ELECTRODE RT ANGLE VERSAPOINT (CUTTING LOOP) ×3 IMPLANT
GLOVE BIO SURGEON STRL SZ 6.5 (GLOVE) ×2 IMPLANT
GLOVE BIO SURGEONS STRL SZ 6.5 (GLOVE) ×1
GLOVE BIOGEL PI IND STRL 7.0 (GLOVE) ×2 IMPLANT
GLOVE BIOGEL PI INDICATOR 7.0 (GLOVE) ×4
GLOVE SURG SS PI 7.0 STRL IVOR (GLOVE) ×18 IMPLANT
GOWN STRL REUS W/TWL LRG LVL3 (GOWN DISPOSABLE) ×6 IMPLANT
PACK VAGINAL MINOR WOMEN LF (CUSTOM PROCEDURE TRAY) ×3 IMPLANT
PAD OB MATERNITY 4.3X12.25 (PERSONAL CARE ITEMS) ×3 IMPLANT
TOWEL OR 17X24 6PK STRL BLUE (TOWEL DISPOSABLE) ×6 IMPLANT
TUBING AQUILEX INFLOW (TUBING) ×3 IMPLANT
TUBING AQUILEX OUTFLOW (TUBING) ×3 IMPLANT
WATER STERILE IRR 1000ML POUR (IV SOLUTION) ×3 IMPLANT

## 2014-07-24 NOTE — Discharge Summary (Signed)
  Physician Discharge Summary  Patient ID: Kathleen Sanford MRN: [id number] DOB/AGE: 11-03-1980 34 y.o.  Admit date: 07/24/2014 Discharge date: 07/24/2014  Admission Diagnoses: Endometrial Polyp/myoma  Discharge Diagnoses: Endometrial Polyp/myoma        Active Problems:   * No active hospital problems. *   Discharged Condition: good  Hospital Course: Outpatient  Consults: None  Treatments: surgery: Hysteroscopy with Versapoint resection, D+C  Disposition: 01-Home or Self Care     Medication List    TAKE these medications        ALPRAZolam 0.5 MG tablet  Commonly known as:  XANAX  Take 0.5 mg by mouth at bedtime as needed for anxiety or sleep.     azithromycin 250 MG tablet  Commonly known as:  ZITHROMAX  Take 250 mg by mouth daily. Patient should complete course 07/23/14.     escitalopram 10 MG tablet  Commonly known as:  LEXAPRO  Take 10 mg by mouth daily.     etonogestrel 68 MG Impl implant  Commonly known as:  NEXPLANON  1 each by Subdermal route once. Administered in September, 2015.     ibuprofen 200 MG tablet  Commonly known as:  ADVIL,MOTRIN  Take 600-800 mg by mouth every 6 (six) hours as needed for headache.     multivitamin with minerals Tabs tablet  Take 1 tablet by mouth daily.      ASK your doctor about these medications        amoxicillin 500 MG capsule  Commonly known as:  AMOXIL  Take 1 capsule (500 mg total) by mouth 3 (three) times daily.           Follow-up Information    Follow up with Jalaya Sarver,MARIE-LYNE, MD In 3 weeks.   Specialty:  Obstetrics and Gynecology   Contact information:   [address] [place] [address] [phone]       Signed: Princess Bruins, MD 07/24/2014, 2:25 PM

## 2014-07-24 NOTE — Anesthesia Preprocedure Evaluation (Signed)

## 2014-07-24 NOTE — Transfer of Care (Signed)
Immediate Anesthesia Transfer of Care Note  Patient: Kathleen Sanford  Procedure(s) Performed: Procedure(s): DILATATION & CURETTAGE/HYSTEROSCOPY WITH VERSAPOINT RESECTION (N/A)  Patient Location: PACU  Anesthesia Type:General  Level of Consciousness: sedated  Airway & Oxygen Therapy: Patient Spontanous Breathing and Patient connected to nasal cannula oxygen  Post-op Assessment: Report given to RN and Post -op Vital signs reviewed and stable  Post vital signs: stable  Last Vitals:  Filed Vitals:   07/24/14 1204  BP: 109/70  Pulse: 66  Temp: 37.1 C  Resp: 16    Complications: No apparent anesthesia complications

## 2014-07-24 NOTE — H&P (Signed)
Kathleen Sanford is an 34 y.o. female  [id number] Adopted 1  RP:  IU lesion, probable polyp for HSC resection, D+C  Pertinent Gynecological History:  Contraception: Nexplanon Blood transfusions: none Sexually transmitted diseases: no past history Previous GYN Procedures:  D+Cs Last pap: normal OB History:  C/S x 3  Menstrual History:  No LMP recorded. Patient has had an implant.    Past Medical History  Diagnosis Date  . MTHFR deficiency complicating pregnancy   . Carcinoma in situ of cervix   . Anxiety   . Depression   . Anemia     Past Surgical History  Procedure Laterality Date  . Cesarean section      x 3  . Tubes in ears    . Dilation and curettage of uterus      mabs - multiple  . Conization of cervix      History reviewed. No pertinent family history.  Social History:  reports that she has never smoked. She has never used smokeless tobacco. She reports that she drinks alcohol. She reports that she does not use illicit drugs.  Allergies:  Allergies  Allergen Reactions  . Norco [Hydrocodone-Acetaminophen] Itching, Nausea And Vomiting and Rash  . Toradol [Ketorolac Tromethamine] Itching, Nausea And Vomiting and Rash    Prescriptions prior to admission  Medication Sig Dispense Refill Last Dose  . ALPRAZolam (XANAX) 0.5 MG tablet Take 0.5 mg by mouth at bedtime as needed for anxiety or sleep.   07/24/2014 at Unknown time  . azithromycin (ZITHROMAX) 250 MG tablet Take 250 mg by mouth daily. Patient should complete course 07/23/14.     Marland Kitchen escitalopram (LEXAPRO) 10 MG tablet Take 10 mg by mouth daily.   07/24/2014 at Unknown time  . etonogestrel (NEXPLANON) 68 MG IMPL implant 1 each by Subdermal route once. Administered in September, 2015.     . ibuprofen (ADVIL,MOTRIN) 200 MG tablet Take 600-800 mg by mouth every 6 (six) hours as needed for headache.     Marland Kitchen amoxicillin (AMOXIL) 500 MG capsule Take 1 capsule (500 mg total) by mouth 3 (three) times daily. (Patient not  taking: Reported on 07/19/2014) 30 capsule 0 Completed Course at Unknown time  . Multiple Vitamin (MULTIVITAMIN WITH MINERALS) TABS tablet Take 1 tablet by mouth daily.   02/18/2014    ROS  Blood pressure 109/70, pulse 66, temperature 98.8 F (37.1 C), temperature source Oral, resp. rate 16, height 5\' 5"  (1.651 m), weight 188 lb (85.276 kg), SpO2 99 %. Physical Exam   [place]:  IU lesion c/w polyp 1.9 cm  Results for orders placed or performed during the hospital encounter of 07/24/14 (from the past 24 hour(s))  CBC     Status: None   Collection Time: 07/24/14 11:57 AM  Result Value Ref Range   WBC 6.9 4.0 - 10.5 K/uL   RBC 4.24 3.87 - 5.11 MIL/uL   Hemoglobin 12.9 12.0 - 15.0 g/dL   HCT 37.6 36.0 - 46.0 %   MCV 88.7 78.0 - 100.0 fL   MCH 30.4 26.0 - 34.0 pg   MCHC 34.3 30.0 - 36.0 g/dL   RDW 12.6 11.5 - 15.5 %   Platelets 262 150 - 400 K/uL  Pregnancy, urine     Status: None   Collection Time: 07/24/14 12:00 PM  Result Value Ref Range   Preg Test, Ur NEGATIVE NEGATIVE    No results found.  Assessment/Plan: IU lesion c/w polyp for [facility] Versapoint resection, D+C.  Surgery and risks  reviewed.  Nakia Remmers,MARIE-LYNE 07/24/2014, 1:06 PM

## 2014-07-24 NOTE — Op Note (Signed)
07/24/2014  2:13 PM  PATIENT:  Kathleen Sanford  34 y.o. female  PRE-OPERATIVE DIAGNOSIS:  Endometrial Polyp/myoma  POST-OPERATIVE DIAGNOSIS:  Endometrial Polyp/myoma  PROCEDURE:  Procedure(s): DILATATION & CURETTAGE/HYSTEROSCOPY WITH VERSAPOINT RESECTION  SURGEON:  Surgeon(s): Princess Bruins, MD  ASSISTANTS: none   ANESTHESIA:   general   PROCEDURE:  Under general anesthesia with laryngeal mask the patient is in lithotomy position. She is prepped with Betadine on the suprapubic, vulvar and vaginal areas. She is draped as usual. The vaginal exam revealed an anteverted uterus, normal volume, no adnexal mass.  The speculum is inserted in the vagina. The anterior lip of the cervix was grasped with a tenaculum. A paracervical block is done with Nesacaine a total of 20 cc at 4 and 8:00.  Dilation of the cervix with Hegar dilators up to #31 without difficulty. Insertion of the operative hysteroscope with the VersaPoint loop.  An intra-uterine lesion is present on the left posterior wall of the cavity.  It measures about 2.0 cm in length with a 1 cm wide base.  The cavity is otherwise completely normal.  Both ostia are well visualized.  It is completely excised with the VersaPoint loop.  Hemostasis was completed at the base with the coag current.  All pieces of the specimen are removed from the cavity.  We then removed the hysteroscope and proceed with the endometrial curettage.  A systematic curettage of the intrauterine cavity on all surfaces is done.  Both specimens are sent together to pathology.  Pictures were taken before and after the procedure. All instruments were removed.  The patient was brought to recovery room in good and stable status.  ESTIMATED BLOOD LOSS: 5 cc FLUID DEFICIT:  100 cc   Intake/Output Summary (Last 24 hours) at 07/24/14 1413 Last data filed at 07/24/14 1403  Gross per 24 hour  Intake   1000 ml  Output     55 ml  Net    945 ml     BLOOD ADMINISTERED:none    LOCAL MEDICATIONS USED:  Amount: 20 ml of Nesacaine  SPECIMEN:  Source of Specimen:  Endometrial curettings and resection of IU lesion  DISPOSITION OF SPECIMEN:  PATHOLOGY  COUNTS:  YES  PLAN OF CARE: Transfer to PACU  Princess Bruins MD  07/24/2014 at 2:13 pm

## 2014-07-24 NOTE — Anesthesia Postprocedure Evaluation (Signed)
Anesthesia Post Note  Patient: Kathleen Sanford  Procedure(s) Performed: Procedure(s) (LRB): DILATATION & CURETTAGE/HYSTEROSCOPY WITH VERSAPOINT RESECTION (N/A)  Anesthesia type: General  Patient location: PACU  Post pain: Pain level controlled  Post assessment: Post-op Vital signs reviewed  Last Vitals:  Filed Vitals:   07/24/14 1500  BP: 106/61  Pulse: 72  Temp:   Resp: 16    Post vital signs: Reviewed  Level of consciousness: sedated  Complications: No apparent anesthesia complications

## 2014-07-24 NOTE — Discharge Instructions (Signed)
Hysteroscopy, Care After °Refer to this sheet in the next few weeks. These instructions provide you with information on caring for yourself after your procedure. Your health care provider may also give you more specific instructions. Your treatment has been planned according to current medical practices, but problems sometimes occur. Call your health care provider if you have any problems or questions after your procedure.  °WHAT TO EXPECT AFTER THE PROCEDURE °After your procedure, it is typical to have the following: °· You may have some cramping. This normally lasts for a couple days. °· You may have bleeding. This can vary from light spotting for a few days to menstrual-like bleeding for 3-7 days. °HOME CARE INSTRUCTIONS °· Rest for the first 1-2 days after the procedure. °· Only take over-the-counter or prescription medicines as directed by your health care provider. Do not take aspirin. It can increase the chances of bleeding. °· Take showers instead of baths for 2 weeks or as directed by your health care provider. °· Do not drive for 24 hours or as directed. °· Do not drink alcohol while taking pain medicine. °· Do not use tampons, douche, or have sexual intercourse for 2 weeks or until your health care provider says it is okay. °· Take your temperature twice a day for 4-5 days. Write it down each time. °· Follow your health care provider's advice about diet, exercise, and lifting. °· If you develop constipation, you may: °¨ Take a mild laxative if your health care provider approves. °¨ Add bran foods to your diet. °¨ Drink enough fluids to keep your urine clear or pale yellow. °· Try to have someone with you or available to you for the first 24-48 hours, especially if you were given a general anesthetic. °· Follow up with your health care provider as directed. °SEEK MEDICAL CARE IF: °· You feel dizzy or lightheaded. °· You feel sick to your stomach (nauseous). °· You have abnormal vaginal discharge. °· You  have a rash. °· You have pain that is not controlled with medicine. °SEEK IMMEDIATE MEDICAL CARE IF: °· You have bleeding that is heavier than a normal menstrual period. °· You have a fever. °· You have increasing cramps or pain, not controlled with medicine. °· You have new belly (abdominal) pain. °· You pass out. °· You have pain in the tops of your shoulders (shoulder strap areas). °· You have shortness of breath. °Document Released: 03/29/2013 Document Reviewed: 03/29/2013 °[employer]® Patient Information ©2015 [employer]. This information is not intended to replace advice given to you by your health care provider. Make sure you discuss any questions you have with your health care provider. ° °

## 2014-07-26 ENCOUNTER — Encounter (HOSPITAL_COMMUNITY): Payer: Self-pay | Admitting: Obstetrics & Gynecology

## 2014-10-30 IMAGING — US US ABDOMEN COMPLETE
1 series · 13 of 25 positions shown · non-contrast
Comparison: None

CLINICAL DATA: Right upper quadrant/epigastric abdominal pain.

COMPLETE ABDOMINAL ULTRASOUND

[Series 1: us abdomen complete · 0.31mm/px · 13 of 64 slices shown]
[im 1/64]
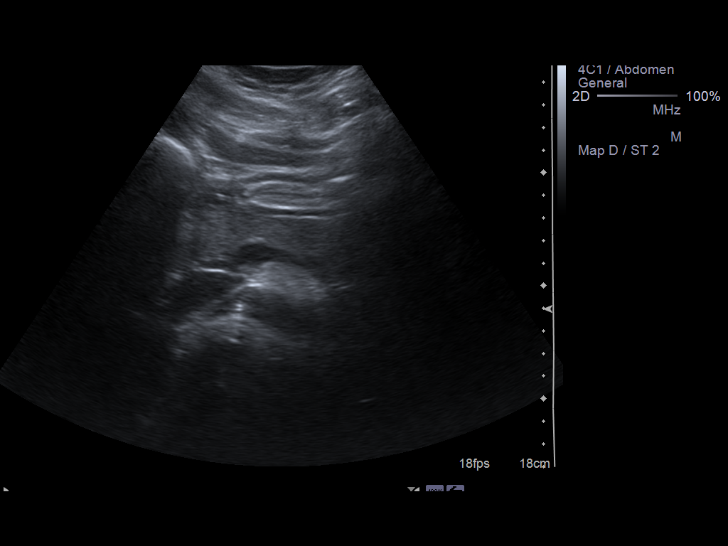
[im 6/64]
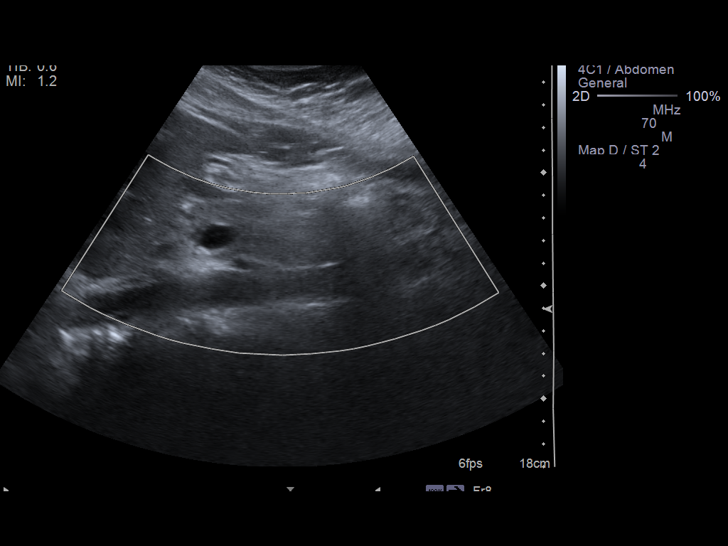
[im 11/64]
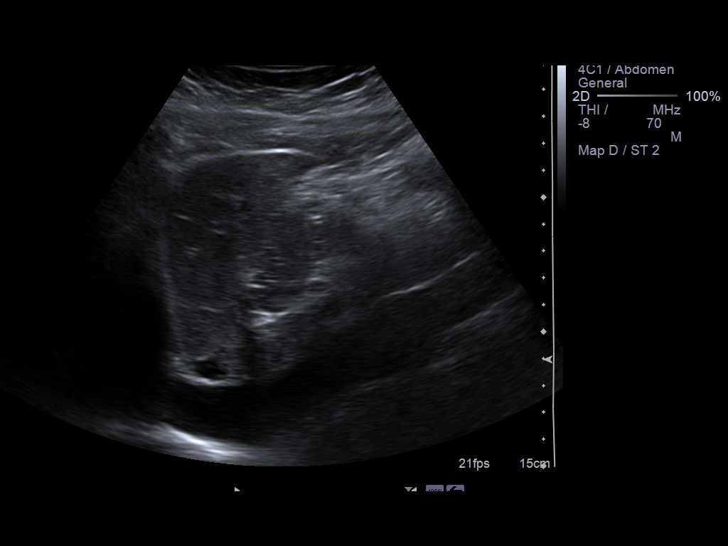
[im 16/64]
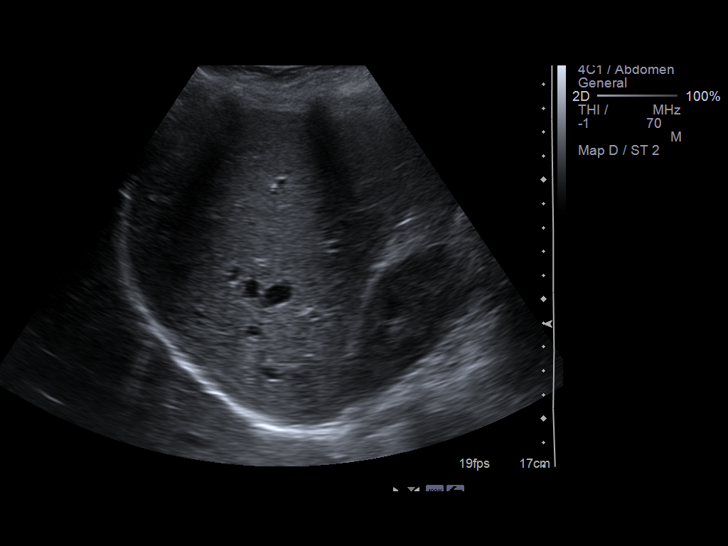
[im 22/64]
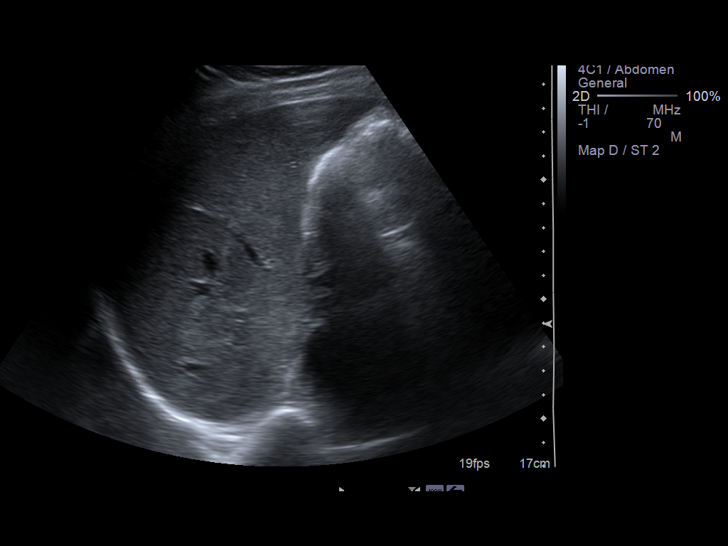
[im 27/64]
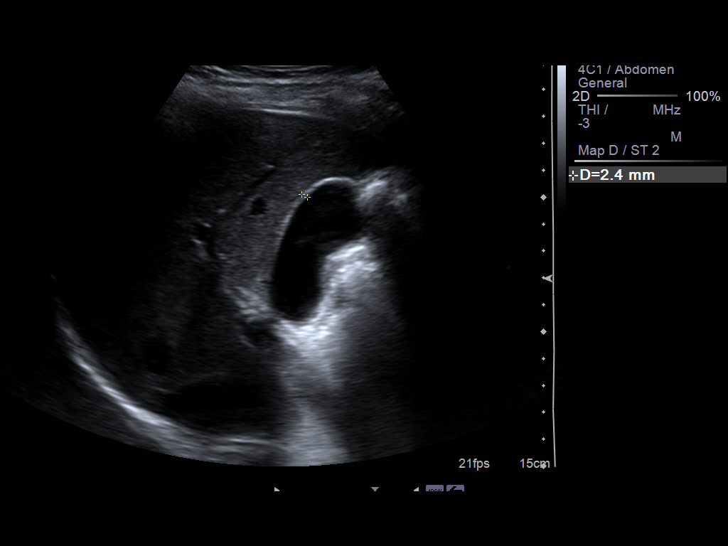
[im 32/64]
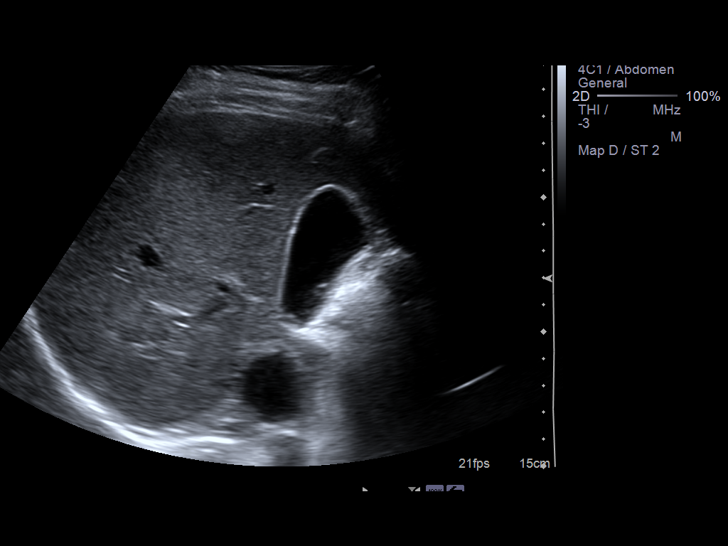
[im 37/64]
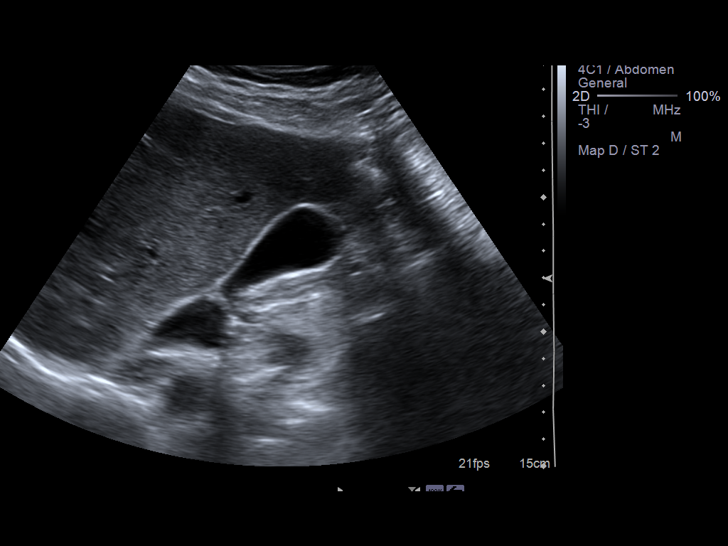
[im 43/64]
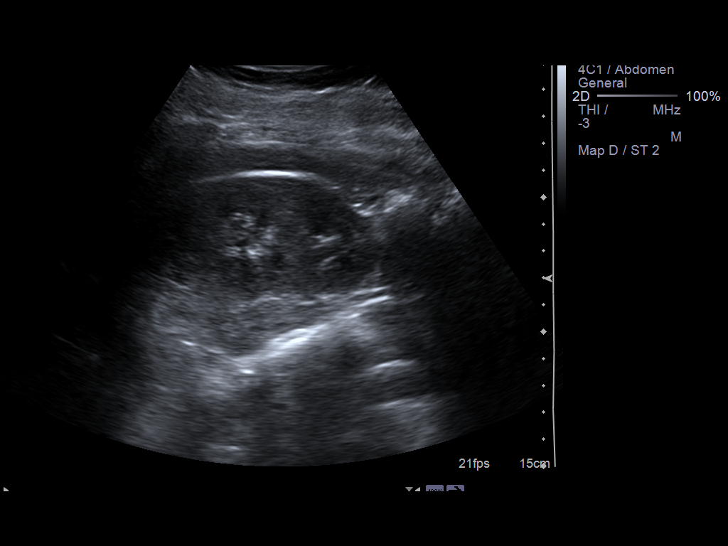
[im 48/64]
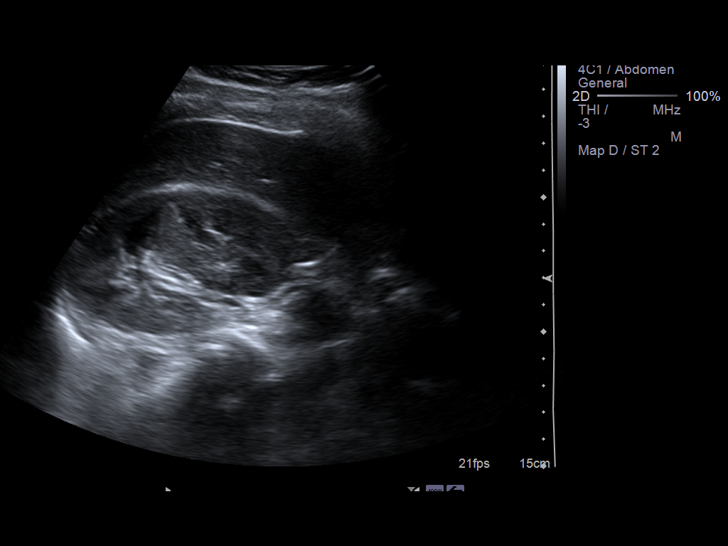
[im 53/64]
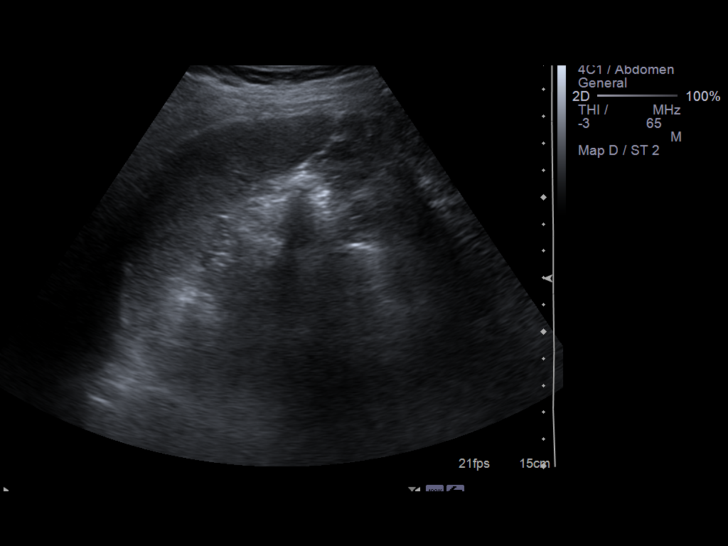
[im 58/64]
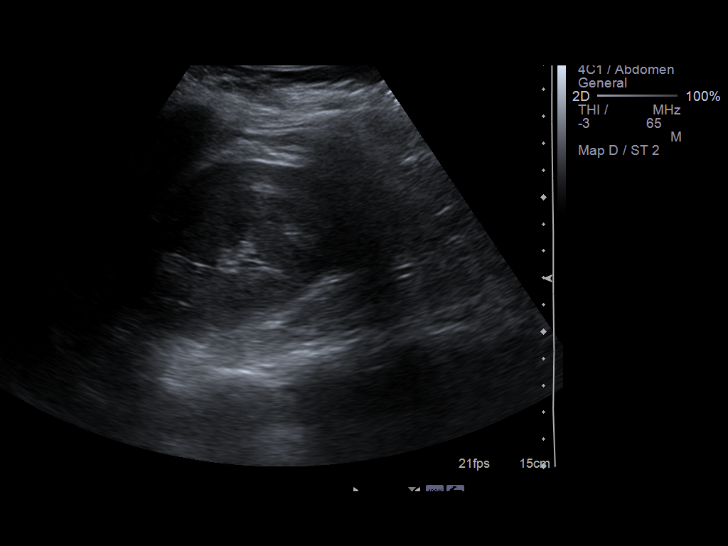
[im 64/64]
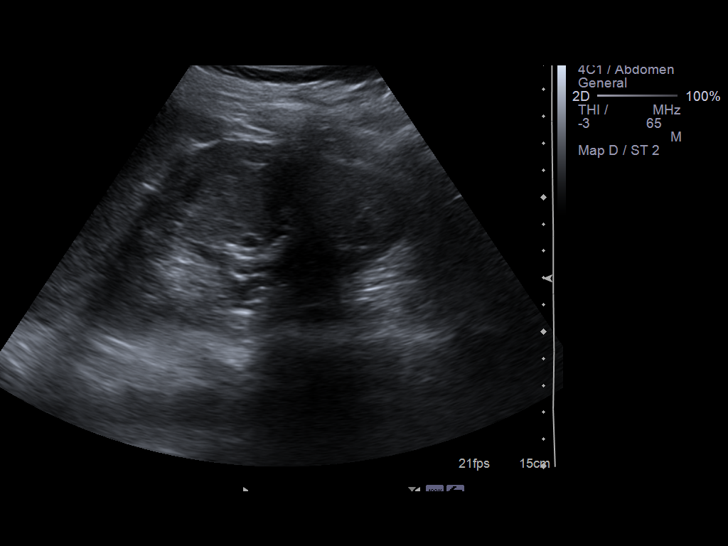

[13 of 25 positions shown; findings below may reference images not displayed]

FINDINGS: Gallbladder: A small amount of debris or sludge is noted within the
gallbladder lumen.  No discrete gallstones are demonstrated.  There
is no gallbladder wall thickening, pericholecystic fluid or
sonographic Murphy's sign.

Common bile duct:   Normal in caliber without filling defects.

Liver:  Echogenicity is within normal limits.  No focal hepatic
abnormalities are identified.

IVC:  Visualized portions appear unremarkable.

Pancreas:  Visualized portions appear unremarkable.Portions of the
pancreatic tail are obscured by bowel gas.

Spleen:  Visualized portions appear unremarkable.

Right Kidney:   The renal cortical thickness and echogenicity are
preserved.  There is no hydronephrosis or focal abnormality. Renal
length is 11.0 cm.

Left Kidney:   The renal cortical thickness and echogenicity are
preserved.  There is no hydronephrosis or focal abnormality. Renal
length is 11.9 cm.

Abdominal aorta:  Visualized portions appear unremarkable.
IMPRESSION: 1.  No acute abdominal findings identified.
2.  Trace debris or sludge within the gallbladder lumen.  No
evidence of cholelithiasis or cholecystitis.
3.  Suboptimal visualization of the pancreatic tail.

## 2014-12-25 IMAGING — US US PELVIS COMPLETE
1 series · 14 of 25 positions shown · non-contrast
Comparison: None

CLINICAL DATA: Confirm placement of intrauterine device.  Cannot
identify string. LMP 10/10/2012

TRANSABDOMINAL AND TRANSVAGINAL ULTRASOUND OF PELVIS
TECHNIQUE: Both transabdominal and transvaginal ultrasound
examinations of the pelvis were performed. Transabdominal technique
was performed for global imaging of the pelvis including uterus,
ovaries, adnexal regions, and pelvic cul-de-sac.
It was necessary to proceed with endovaginal exam following the
transabdominal exam to visualize the uterus, endometrium, ovaries,
adnexal regions..

[Series 1: us pelvis complete · 0.32mm/px · 14 of 44 slices shown]
[im 1/44]
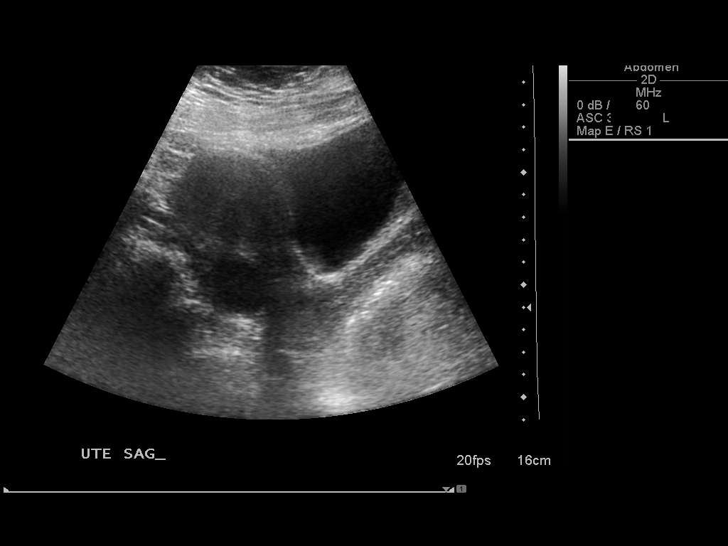
[im 4/44]
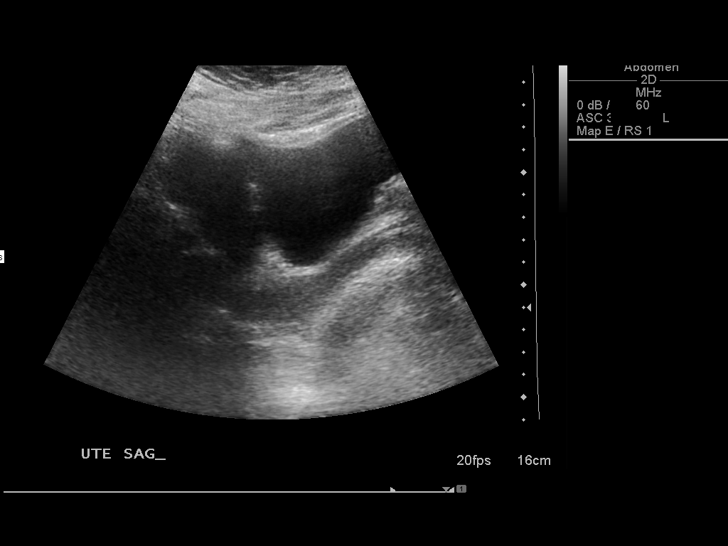
[im 8/44]
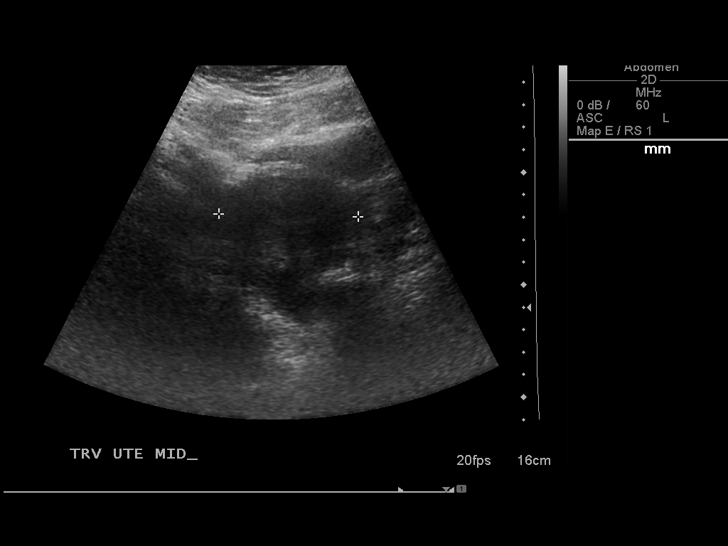
[im 11/44]
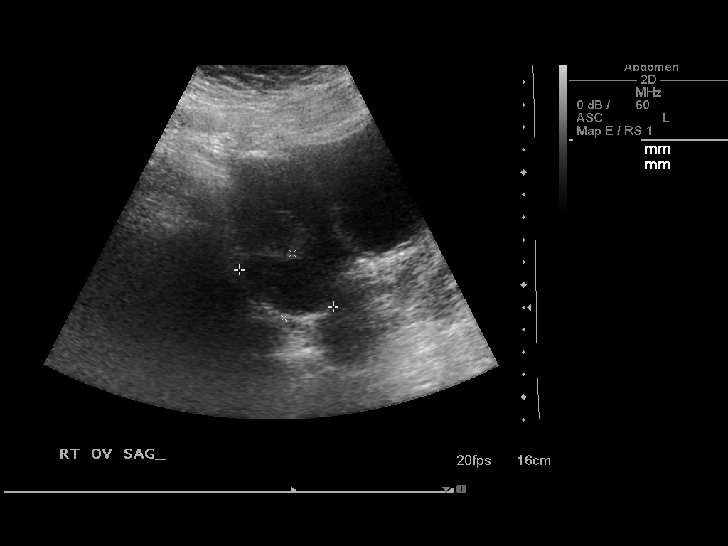
[im 15/44]
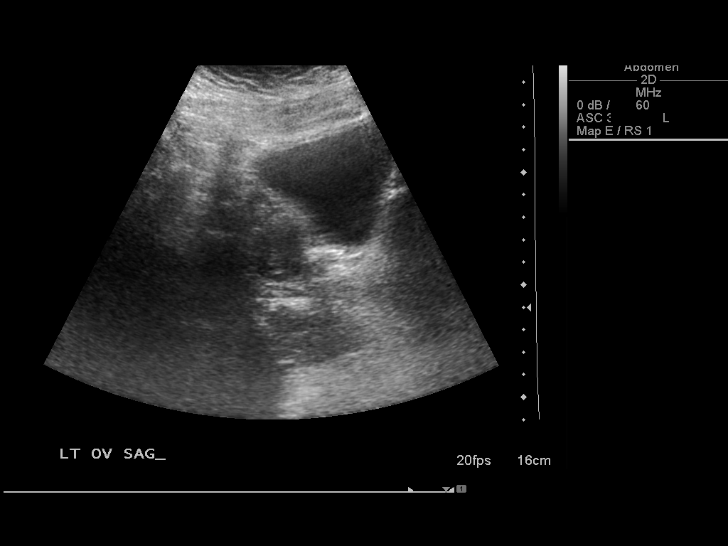
[im 17/44]
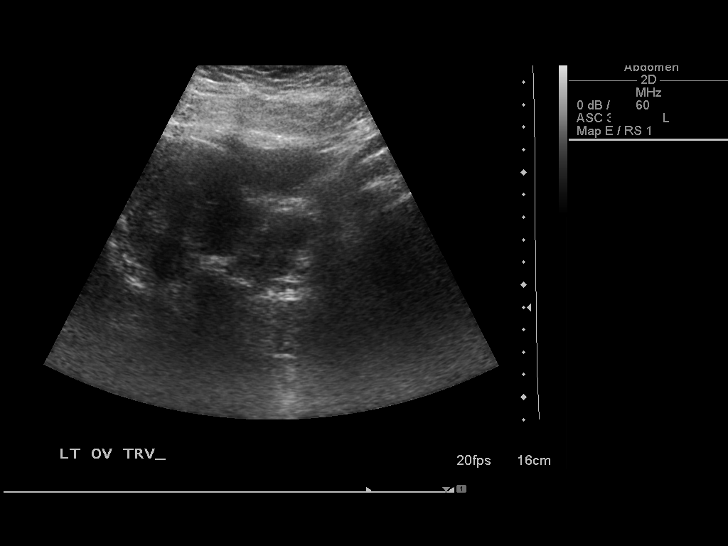
[im 20/44]
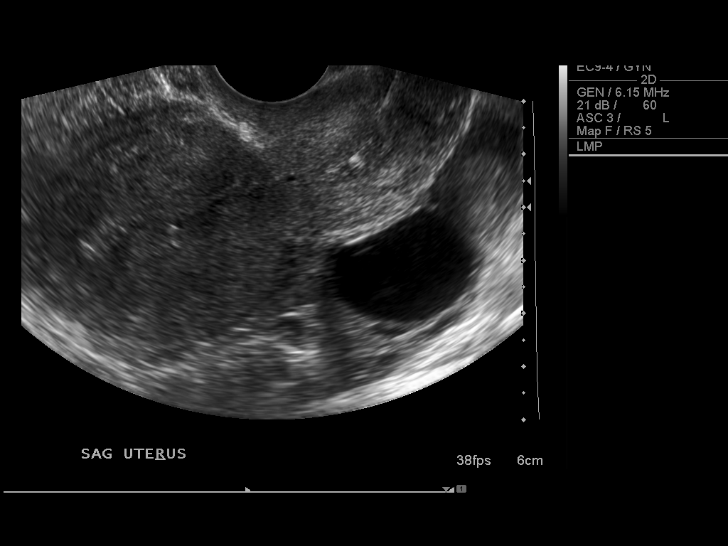
[im 24/44]
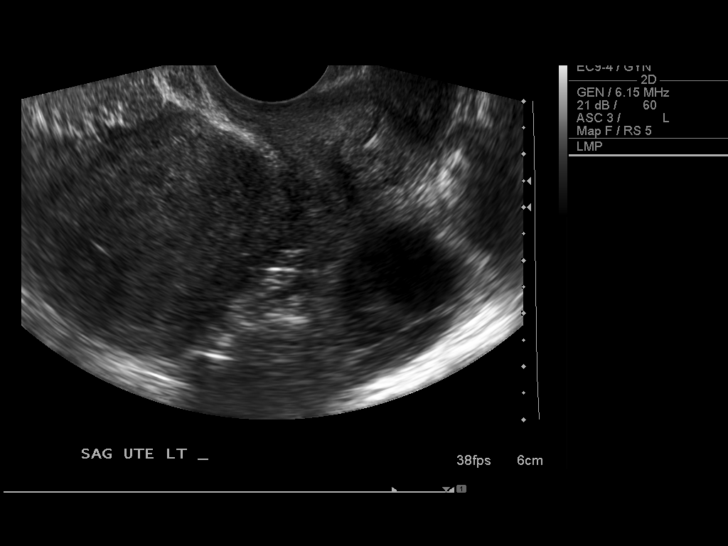
[im 27/44]
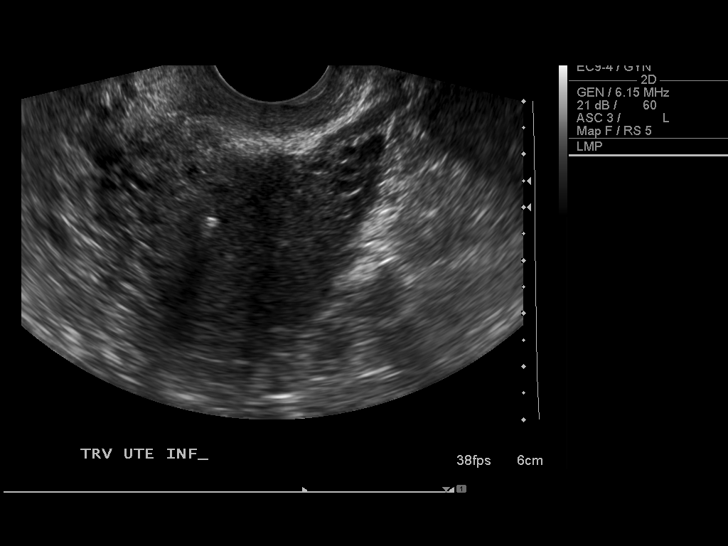
[im 29/44]
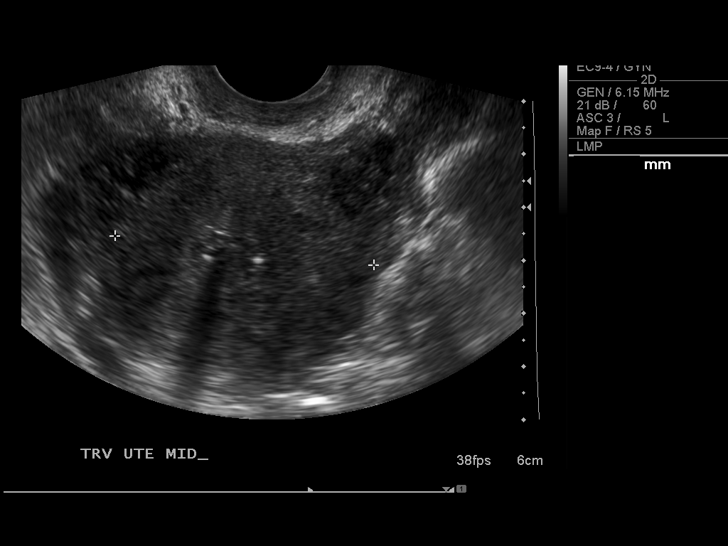
[im 33/44]
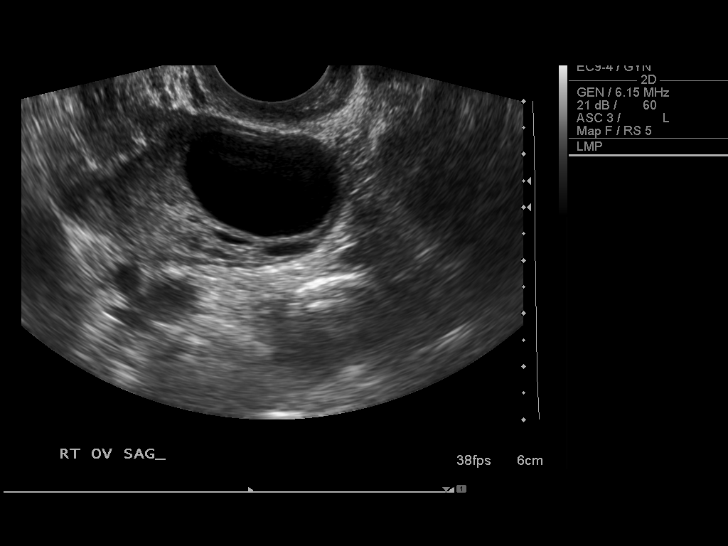
[im 36/44]
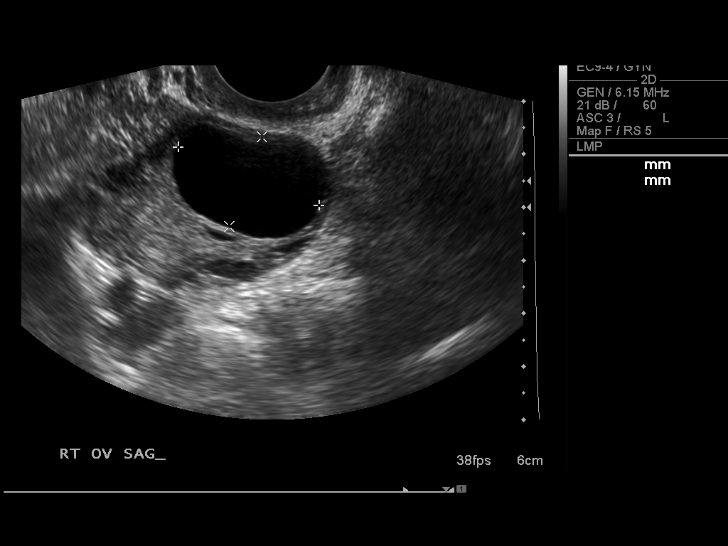
[im 40/44]
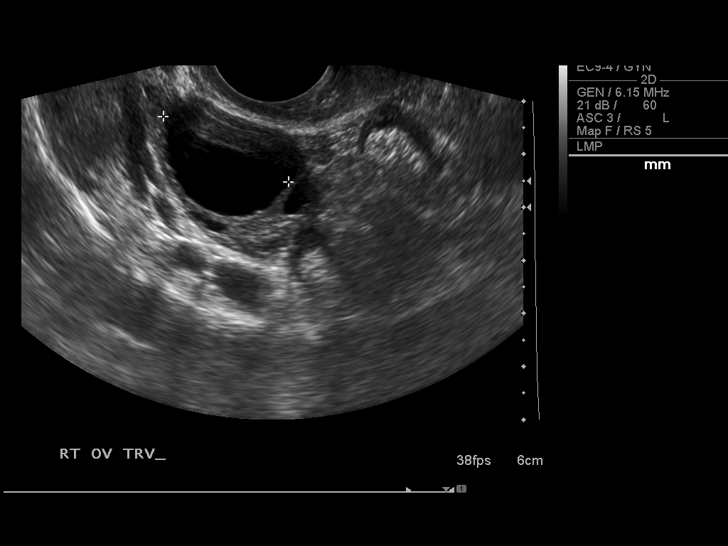
[im 44/44]
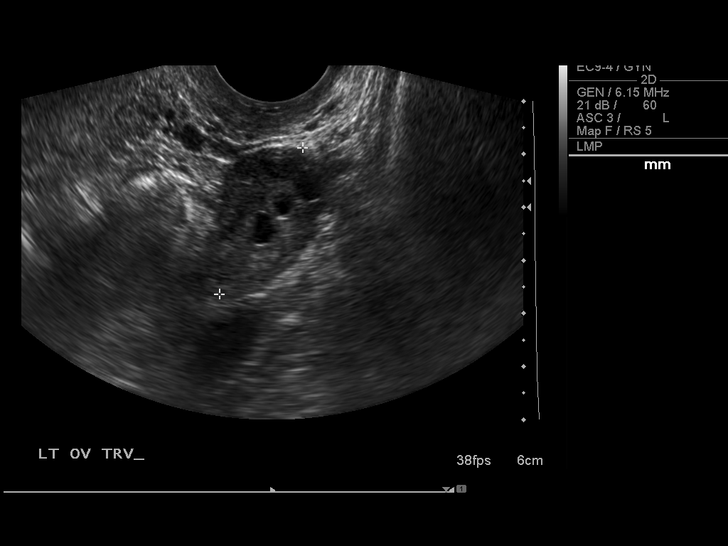

[14 of 25 positions shown; findings below may reference images not displayed]

FINDINGS: Uterus: 10.1 x 4.6 x 6.2 cm.  Normal appearance.

Endometrium: 8 mm in thickness.  Homogeneous in appearance.
Intrauterine device is identified in the central canal, in expected
location.

Right ovary:  4.3 x 2.9 x 4.1 cm.  Largest follicle is 2.9 cm.

Left ovary: 3.5 x 3.0 x 3.2 cm.  Normal appearance.

Other findings: No free fluid
IMPRESSION: 1.  Intrauterine device in expected location.
2.  Normal appearance of the ovaries..
